# Patient Record
Sex: Male | Born: 1960 | Hispanic: Yes | Marital: Married
Health system: Southern US, Community
[De-identification: ages and names within clinical notes are randomized; demographics above are authoritative.]

## PROBLEM LIST (undated history)

## (undated) DIAGNOSIS — E876 Hypokalemia: Secondary | ICD-10-CM

## (undated) DIAGNOSIS — G4700 Insomnia, unspecified: Secondary | ICD-10-CM

## (undated) DIAGNOSIS — K219 Gastro-esophageal reflux disease without esophagitis: Secondary | ICD-10-CM

## (undated) DIAGNOSIS — M48061 Spinal stenosis, lumbar region without neurogenic claudication: Secondary | ICD-10-CM

## (undated) DIAGNOSIS — F419 Anxiety disorder, unspecified: Secondary | ICD-10-CM

## (undated) DIAGNOSIS — M4310 Spondylolisthesis, site unspecified: Secondary | ICD-10-CM

---

## 2014-03-10 ENCOUNTER — Emergency Department: Payer: Self-pay | Admitting: Emergency Medicine

## 2014-06-15 ENCOUNTER — Emergency Department: Payer: Self-pay | Admitting: Emergency Medicine

## 2017-09-24 ENCOUNTER — Emergency Department: Payer: Medicaid Other

## 2017-09-24 ENCOUNTER — Other Ambulatory Visit: Payer: Self-pay

## 2017-09-24 ENCOUNTER — Emergency Department
Admission: EM | Admit: 2017-09-24 | Discharge: 2017-09-25 | Disposition: A | Discharge status: Home / Self Care | Payer: Medicaid Other | Attending: Emergency Medicine | Admitting: Emergency Medicine

## 2017-09-24 ENCOUNTER — Encounter: Payer: Self-pay | Admitting: Emergency Medicine

## 2017-09-24 DIAGNOSIS — T424X1A Poisoning by benzodiazepines, accidental (unintentional), initial encounter: Secondary | ICD-10-CM | POA: Insufficient documentation

## 2017-09-24 DIAGNOSIS — R4182 Altered mental status, unspecified: Secondary | ICD-10-CM | POA: Diagnosis present

## 2017-09-24 HISTORY — DX: Gastro-esophageal reflux disease without esophagitis: K21.9

## 2017-09-24 HISTORY — DX: Anxiety disorder, unspecified: F41.9

## 2017-09-24 HISTORY — DX: Spondylolisthesis, site unspecified: M43.10

## 2017-09-24 HISTORY — DX: Hypokalemia: E87.6

## 2017-09-24 HISTORY — DX: Spinal stenosis, lumbar region without neurogenic claudication: M48.061

## 2017-09-24 HISTORY — DX: Insomnia, unspecified: G47.00

## 2017-09-24 LAB — URINALYSIS, COMPLETE (UACMP) WITH MICROSCOPIC
BACTERIA UA: NONE SEEN
Bilirubin Urine: NEGATIVE
Glucose, UA: NEGATIVE mg/dL
Ketones, ur: NEGATIVE mg/dL
Leukocytes, UA: NEGATIVE
Nitrite: NEGATIVE
Protein, ur: NEGATIVE mg/dL
SQUAMOUS EPITHELIAL / LPF: NONE SEEN
Specific Gravity, Urine: 1.015 (ref 1.005–1.030)
pH: 5 (ref 5.0–8.0)

## 2017-09-24 LAB — CBC WITH DIFFERENTIAL/PLATELET
BASOS PCT: 0 %
Basophils Absolute: 0 10*3/uL (ref 0–0.1)
EOS ABS: 0.1 10*3/uL (ref 0–0.7)
Eosinophils Relative: 1 %
HEMATOCRIT: 44.5 % (ref 40.0–52.0)
HEMOGLOBIN: 14.5 g/dL (ref 13.0–18.0)
LYMPHS ABS: 0.6 10*3/uL — AB (ref 1.0–3.6)
Lymphocytes Relative: 5 %
MCH: 28.8 pg (ref 26.0–34.0)
MCHC: 32.5 g/dL (ref 32.0–36.0)
MCV: 88.7 fL (ref 80.0–100.0)
Monocytes Absolute: 0.9 10*3/uL (ref 0.2–1.0)
Monocytes Relative: 7 %
NEUTROS ABS: 11.7 10*3/uL — AB (ref 1.4–6.5)
NEUTROS PCT: 87 %
Platelets: 168 10*3/uL (ref 150–440)
RBC: 5.02 MIL/uL (ref 4.40–5.90)
RDW: 14.5 % (ref 11.5–14.5)
WBC: 13.3 10*3/uL — AB (ref 3.8–10.6)

## 2017-09-24 LAB — URINE DRUG SCREEN, QUALITATIVE (ARMC ONLY)
Amphetamines, Ur Screen: NOT DETECTED
BARBITURATES, UR SCREEN: NOT DETECTED
Benzodiazepine, Ur Scrn: POSITIVE — AB
COCAINE METABOLITE, UR ~~LOC~~: NOT DETECTED
Cannabinoid 50 Ng, Ur ~~LOC~~: NOT DETECTED
MDMA (ECSTASY) UR SCREEN: NOT DETECTED
METHADONE SCREEN, URINE: NOT DETECTED
OPIATE, UR SCREEN: NOT DETECTED
Phencyclidine (PCP) Ur S: NOT DETECTED
Tricyclic, Ur Screen: POSITIVE — AB

## 2017-09-24 LAB — GLUCOSE, CAPILLARY: Glucose-Capillary: 227 mg/dL — ABNORMAL HIGH (ref 65–99)

## 2017-09-24 LAB — PROTIME-INR
INR: 0.99
Prothrombin Time: 13 seconds (ref 11.4–15.2)

## 2017-09-24 LAB — ETHANOL: Alcohol, Ethyl (B): <10 mg/dL (ref <10)

## 2017-09-24 NOTE — ED Notes (Signed)
Patient transported to CT 

## 2017-09-24 NOTE — ED Provider Notes (Signed)
[facility] Emergency Department Provider Note  ____________________________________________   First MD Initiated Contact with Patient 09/24/17 2254     (approximate)  I have reviewed the triage vital signs and the nursing notes.   HISTORY  Chief Complaint Drug Overdose  L5 exemption history limited by the patient's clinical condition  HPI James Beck is a 57 y.o. male comes to the emergency department for altered mental status.  According to EMS the patient may have taken some combination of hydroxyzine, clonazepam, Seroquel, and Xanax earlier today.  When EMS arrived he was responsive to painful stimulus.  Normal blood sugar in route.  Further history limited by his clinical condition.  According to EMS the patient does have a recent history of unintentional ingestion of 5 tablets of Seroquel.   Past Medical History:  Diagnosis Date  . Anxiety   . GERD (gastroesophageal reflux disease)   . Hypokalemia   . Insomnia   . Lumbar stenosis   . Spondylolisthesis     There are no active problems to display for this patient.     Prior to Admission medications   Not on File    Allergies Patient has no known allergies.  History reviewed. No pertinent family history.  Social History Social History   Tobacco Use  . Smoking status: Unknown If Ever Smoked  Substance Use Topics  . Alcohol use: No    Frequency: Never  . Drug use: No    Review of Systems Level 5 exemption history limited by the patient's clinical condition  ____________________________________________   PHYSICAL EXAM:  VITAL SIGNS: ED Triage Vitals [09/24/17 2252]  Enc Vitals Group     BP      Pulse      Resp      Temp      Temp src      SpO2 (!) 80 %     Weight      Height      Head Circumference      Peak Flow      Pain Score      Pain Loc      Pain Edu?      Excl. in GC?     Constitutional: Somnolent but responsive to verbal stimulus alert and  oriented x4 falls asleep quickly Eyes: PERRL EOMI. pupils are 3 mm to 2 mm and brisk Head: Atraumatic. Nose: No congestion/rhinnorhea. Mouth/Throat: No trismus Neck: No stridor.   Cardiovascular: Normal rate, regular rhythm. Grossly normal heart sounds.  Good peripheral circulation. Respiratory: Decreased respiratory effort lungs are clear no retractions Gastrointestinal: Soft nontender Musculoskeletal: No lower extremity edema   Neurologic: Moves all 4 feels all 4 Skin:  Skin is warm, dry and intact. No rash noted. Psychiatric: Somnolent  ____________________________________________   DIFFERENTIAL includes but not limited to  Alcohol intoxication, benzodiazepine overdose, opiate overdose, metabolic derangement, intracerebral hemorrhage, meningitis, encephalitis ____________________________________________   LABS (all labs ordered are listed, but only abnormal results are displayed)  Labs Reviewed  GLUCOSE, CAPILLARY - Abnormal; Notable for the following components:      Result Value   Glucose-Capillary 227 (*)    All other components within normal limits  URINALYSIS, COMPLETE (UACMP) WITH MICROSCOPIC - Abnormal; Notable for the following components:   Color, Urine YELLOW (*)    APPearance CLEAR (*)    Hgb urine dipstick MODERATE (*)    All other components within normal limits  ACETAMINOPHEN LEVEL - Abnormal; Notable for the following components:   Acetaminophen (  Tylenol), Serum <10 (*)    All other components within normal limits  COMPREHENSIVE METABOLIC PANEL - Abnormal; Notable for the following components:   Chloride 97 (*)    CO2 33 (*)    Glucose, Bld 193 (*)    Calcium 8.7 (*)    ALT 16 (*)    All other components within normal limits  CBC WITH DIFFERENTIAL/PLATELET - Abnormal; Notable for the following components:   WBC 13.3 (*)    Neutro Abs 11.7 (*)    Lymphs Abs 0.6 (*)    All other components within normal limits  URINE DRUG SCREEN, QUALITATIVE ([facility]  ONLY) - Abnormal; Notable for the following components:   Tricyclic, Ur Screen POSITIVE (*)    Benzodiazepine, Ur Scrn POSITIVE (*)    All other components within normal limits  ETHANOL  SALICYLATE LEVEL  TSH  AMMONIA  CK  PROTIME-INR    Lab work reviewed by me with a number of abnormalities including benzodiazepine and tricyclic positive.  Elevated white count is nonspecific and likely secondary to stress __________________________________________  EKG  ED ECG REPORT I, Merrily BrittleNeil Jedediah Noda, the attending physician, personally viewed and interpreted this ECG.  Date: 09/24/2017 EKG Time:  Rate: 105 Rhythm: Sinus tachycardia QRS Axis: normal Intervals: normal ST/T Wave abnormalities: normal Narrative Interpretation: no evidence of acute ischemia  ____________________________________________  RADIOLOGY  Head CT reviewed by me with no acute disease Chest x-ray reviewed by me with slight edema ____________________________________________   PROCEDURES  Procedure(s) performed: no  Procedures  Critical Care performed: no  Observation: no ____________________________________________   INITIAL IMPRESSION / ASSESSMENT AND PLAN / ED COURSE  Pertinent labs & imaging results that were available during my care of the patient were reviewed by me and considered in my medical decision making (see chart for details).  On arrival the patient is clearly confused and not behaving appropriately.  Pupils are 3-2 mm and brisk.  Doubt opioid overdose at this time.  We will continue supportive care will broad workup including head CT is pending.  He does have a small laceration to the anterior right aspect of his tongue.  Denies history of seizure disorder.     ----------------------------------------- 11:39 PM on 09/24/2017 -----------------------------------------  Just back from CT scan.  Still slurring his speech and behaving bizarre, however protecting his  airway. ____________________________________________   ----------------------------------------- 6:13 AM on 09/25/2017 -----------------------------------------  The patient is now considerably more awake and appropriate.  He reports taking 3 clonazepam as well as 3 Xanax prior to going to sleep which is consistent with his toxidrome.  His wife is able to drive him home later this morning.  He is medically stable for outpatient management verbalizes understanding and agreement with the plan.  FINAL CLINICAL IMPRESSION(S) / ED DIAGNOSES  Final diagnoses:  Benzodiazepine overdose, accidental or unintentional, initial encounter      NEW MEDICATIONS STARTED DURING THIS VISIT:  New Prescriptions   No medications on file     Note:  This document was prepared using Dragon voice recognition software and may include unintentional dictation errors.     Merrily Brittleifenbark, Tysheka Fanguy, MD 09/25/17 [phone]

## 2017-09-24 NOTE — ED Notes (Signed)
Pt 94% on 4L via .

## 2017-09-24 NOTE — ED Triage Notes (Signed)
Pt presents to ED via [facility] with c/o possible drug overdose. EMS reports pt's girlfriend found patient unresponsive, EMS reports that patient would snore in response to sternal rubs. EMS reports snoring respirations with periods of apnea, and unresponsive.  Per EMS pt was 80% on RA, on 4L via [place] pt 98%. Upon arrival pt is noted to be more alert, states he took Klonopin, Xanax, hydroxyzine. Pt is noted to be lethargic, slurred speech.

## 2017-09-25 LAB — COMPREHENSIVE METABOLIC PANEL
ALBUMIN: 4 g/dL (ref 3.5–5.0)
ALT: 16 U/L — ABNORMAL LOW (ref 17–63)
AST: 22 U/L (ref 15–41)
Alkaline Phosphatase: 70 U/L (ref 38–126)
Anion gap: 8 (ref 5–15)
BILIRUBIN TOTAL: 0.5 mg/dL (ref 0.3–1.2)
BUN: 11 mg/dL (ref 6–20)
CHLORIDE: 97 mmol/L — AB (ref 101–111)
CO2: 33 mmol/L — ABNORMAL HIGH (ref 22–32)
Calcium: 8.7 mg/dL — ABNORMAL LOW (ref 8.9–10.3)
Creatinine, Ser: 1.15 mg/dL (ref 0.61–1.24)
GFR calc Af Amer: >60 mL/min (ref >60)
GFR calc non Af Amer: >60 mL/min (ref >60)
GLUCOSE: 193 mg/dL — AB (ref 65–99)
POTASSIUM: 4.8 mmol/L (ref 3.5–5.1)
Sodium: 138 mmol/L (ref 135–145)
Total Protein: 7.4 g/dL (ref 6.5–8.1)

## 2017-09-25 LAB — SALICYLATE LEVEL: Salicylate Lvl: <7 mg/dL (ref 2.8–30.0)

## 2017-09-25 LAB — CK: CK TOTAL: 49 U/L (ref 49–397)

## 2017-09-25 LAB — ACETAMINOPHEN LEVEL

## 2017-09-25 LAB — TSH: TSH: 0.701 u[IU]/mL (ref 0.350–4.500)

## 2017-09-25 LAB — AMMONIA: AMMONIA: 20 umol/L (ref 9–35)

## 2017-09-25 NOTE — Discharge Instructions (Signed)
DO NOT EVER TAKE ANYONE ELSE'S PRESCRIPTION AGAIN - IT IS EXTREMELY DANGEROUS!  DO NOT EVER MIX KLONOPIN AND XANAX AGAIN - IT CAN MAKE YOU STOP BREATHING AND YOU CAN DIE  Today you were very lucky and nothing dangerous happened.  Please follow-up with your primary care physician tomorrow for recheck and return to the emergency department for any concerns.  It was a pleasure to take care of you today, and thank you for coming to our emergency department.  If you have any questions or concerns before leaving please ask the nurse to grab me and I'm more than happy to go through your aftercare instructions again.  If you were prescribed any opioid pain medication today such as Norco, Vicodin, Percocet, morphine, hydrocodone, or oxycodone please make sure you do not drive when you are taking this medication as it can alter your ability to drive safely.  If you have any concerns once you are home that you are not improving or are in fact getting worse before you can make it to your follow-up appointment, please do not hesitate to call 911 and come back for further evaluation.  Merrily BrittleNeil Tavia Stave, MD  Results for orders placed or performed during the hospital encounter of 09/24/17  Glucose, capillary  Result Value Ref Range   Glucose-Capillary 227 (H) 65 - 99 mg/dL  Urinalysis, Complete w Microscopic  Result Value Ref Range   Color, Urine YELLOW (A) YELLOW   APPearance CLEAR (A) CLEAR   Specific Gravity, Urine 1.015 1.005 - 1.030   pH 5.0 5.0 - 8.0   Glucose, UA NEGATIVE NEGATIVE mg/dL   Hgb urine dipstick MODERATE (A) NEGATIVE   Bilirubin Urine NEGATIVE NEGATIVE   Ketones, ur NEGATIVE NEGATIVE mg/dL   Protein, ur NEGATIVE NEGATIVE mg/dL   Nitrite NEGATIVE NEGATIVE   Leukocytes, UA NEGATIVE NEGATIVE   RBC / HPF 0-5 0 - 5 RBC/hpf   WBC, UA 0-5 0 - 5 WBC/hpf   Bacteria, UA NONE SEEN NONE SEEN   Squamous Epithelial / LPF NONE SEEN NONE SEEN   Mucus PRESENT   Acetaminophen level  Result Value Ref  Range   Acetaminophen (Tylenol), Serum <10 (L) 10 - 30 ug/mL  Comprehensive metabolic panel  Result Value Ref Range   Sodium 138 135 - 145 mmol/L   Potassium 4.8 3.5 - 5.1 mmol/L   Chloride 97 (L) 101 - 111 mmol/L   CO2 33 (H) 22 - 32 mmol/L   Glucose, Bld 193 (H) 65 - 99 mg/dL   BUN 11 6 - 20 mg/dL   Creatinine, Ser 1.611.15 0.61 - 1.24 mg/dL   Calcium 8.7 (L) 8.9 - 10.3 mg/dL   Total Protein 7.4 6.5 - 8.1 g/dL   Albumin 4.0 3.5 - 5.0 g/dL   AST 22 15 - 41 U/L   ALT 16 (L) 17 - 63 U/L   Alkaline Phosphatase 70 38 - 126 U/L   Total Bilirubin 0.5 0.3 - 1.2 mg/dL   GFR calc non Af Amer >60 >60 mL/min   GFR calc Af Amer >60 >60 mL/min   Anion gap 8 5 - 15  Ethanol  Result Value Ref Range   Alcohol, Ethyl (B) <10 <10 mg/dL  Salicylate level  Result Value Ref Range   Salicylate Lvl <7.0 2.8 - 30.0 mg/dL  CBC with Differential  Result Value Ref Range   WBC 13.3 (H) 3.8 - 10.6 K/uL   RBC 5.02 4.40 - 5.90 MIL/uL   Hemoglobin 14.5 13.0 - 18.0  g/dL   HCT 56.2 13.0 - 86.5 %   MCV 88.7 80.0 - 100.0 fL   MCH 28.8 26.0 - 34.0 pg   MCHC 32.5 32.0 - 36.0 g/dL   RDW 78.4 69.6 - 29.5 %   Platelets 168 150 - 440 K/uL   Neutrophils Relative % 87 %   Neutro Abs 11.7 (H) 1.4 - 6.5 K/uL   Lymphocytes Relative 5 %   Lymphs Abs 0.6 (L) 1.0 - 3.6 K/uL   Monocytes Relative 7 %   Monocytes Absolute 0.9 0.2 - 1.0 K/uL   Eosinophils Relative 1 %   Eosinophils Absolute 0.1 0 - 0.7 K/uL   Basophils Relative 0 %   Basophils Absolute 0.0 0 - 0.1 K/uL  Urine Drug Screen, Qualitative  Result Value Ref Range   Tricyclic, Ur Screen POSITIVE (A) NONE DETECTED   Amphetamines, Ur Screen NONE DETECTED NONE DETECTED   MDMA (Ecstasy)Ur Screen NONE DETECTED NONE DETECTED   Cocaine Metabolite,Ur [place] NONE DETECTED NONE DETECTED   Opiate, Ur Screen NONE DETECTED NONE DETECTED   Phencyclidine (PCP) Ur S NONE DETECTED NONE DETECTED   Cannabinoid 50 Ng, Ur [place] NONE DETECTED NONE DETECTED   Barbiturates, Ur Screen  NONE DETECTED NONE DETECTED   Benzodiazepine, Ur Scrn POSITIVE (A) NONE DETECTED   Methadone Scn, Ur NONE DETECTED NONE DETECTED  TSH  Result Value Ref Range   TSH 0.701 0.350 - 4.500 uIU/mL  Ammonia  Result Value Ref Range   Ammonia 20 9 - 35 umol/L  CK  Result Value Ref Range   Total CK 49 49 - 397 U/L  Protime-INR  Result Value Ref Range   Prothrombin Time 13.0 11.4 - 15.2 seconds   INR 0.99    Ct Head Wo Contrast  Result Date: 09/25/2017 CLINICAL DATA:  Initial evaluation for acute altered mental status, possible drug overdose. EXAM: CT HEAD WITHOUT CONTRAST TECHNIQUE: Contiguous axial images were obtained from the base of the skull through the vertex without intravenous contrast. COMPARISON:  None. FINDINGS: Brain: Age-related cerebral atrophy. No acute intracranial hemorrhage. No acute large vessel territory infarct. No mass lesion, midline shift or mass effect. No hydrocephalus. Gray-white matter differentiation maintained at this time. No extra-axial fluid collection. Vascular: No asymmetric hyperdense vessel. Skull: Scalp soft tissues and calvarium within normal limits. Sinuses/Orbits: Globes and orbital soft tissues normal. Moderate mucosal thickening within the frontal ethmoidal sinuses as well as the maxillary sinuses. Mastoid air cells are clear. Other: None. IMPRESSION: 1. No acute intracranial abnormality. 2. Moderate inflammatory/allergic paranasal sinus disease. Electronically Signed   By: Rise Mu M.D.   On: 09/25/2017 00:13   Dg Chest Port 1 View  Result Date: 09/24/2017 CLINICAL DATA:  Possible drug overdose.  Dyspnea. EXAM: PORTABLE CHEST 1 VIEW COMPARISON:  None. FINDINGS: Heart is borderline enlarged. No aortic aneurysm. Mild interstitial edema without pneumonic consolidation, effusion or pneumothorax. No acute osseous abnormality. IMPRESSION: Borderline cardiomegaly with mild interstitial edema. Electronically Signed   By: Tollie Eth M.D.   On: 09/24/2017  23:45

## 2017-09-25 NOTE — ED Notes (Signed)
O2 taken off pt. Pt was able to become alert enough to maintain oxygen level.

## 2017-09-25 NOTE — ED Notes (Addendum)
Pt has been having periods of apnea while sleeping, with O2 dropping down to as low as 81% sat but will increase above 90 when pt instructed to breath deeply through nose, pt on 4L o2 nasal canula

## 2017-09-25 NOTE — ED Notes (Signed)
Pt placed back on 4L O2 to increase oxygen saturation

## 2017-09-25 NOTE — ED Notes (Signed)
Oxygen taken off of pt to see if pt can maintain Oxygen saturation above 90%

## 2018-03-28 ENCOUNTER — Emergency Department: Payer: Medicaid Other

## 2018-03-28 ENCOUNTER — Encounter: Payer: Self-pay | Admitting: Emergency Medicine

## 2018-03-28 ENCOUNTER — Emergency Department
Admission: EM | Admit: 2018-03-28 | Discharge: 2018-03-28 | Disposition: A | Discharge status: Home / Self Care | Payer: Medicaid Other | Attending: Emergency Medicine | Admitting: Emergency Medicine

## 2018-03-28 DIAGNOSIS — Y929 Unspecified place or not applicable: Secondary | ICD-10-CM | POA: Diagnosis not present

## 2018-03-28 DIAGNOSIS — Y9301 Activity, walking, marching and hiking: Secondary | ICD-10-CM | POA: Diagnosis not present

## 2018-03-28 DIAGNOSIS — W010XXA Fall on same level from slipping, tripping and stumbling without subsequent striking against object, initial encounter: Secondary | ICD-10-CM | POA: Insufficient documentation

## 2018-03-28 DIAGNOSIS — Y999 Unspecified external cause status: Secondary | ICD-10-CM | POA: Insufficient documentation

## 2018-03-28 DIAGNOSIS — S301XXA Contusion of abdominal wall, initial encounter: Secondary | ICD-10-CM

## 2018-03-28 DIAGNOSIS — I951 Orthostatic hypotension: Secondary | ICD-10-CM | POA: Diagnosis not present

## 2018-03-28 DIAGNOSIS — R55 Syncope and collapse: Secondary | ICD-10-CM | POA: Diagnosis present

## 2018-03-28 LAB — URINALYSIS, COMPLETE (UACMP) WITH MICROSCOPIC
Bacteria, UA: NONE SEEN
Bilirubin Urine: NEGATIVE
GLUCOSE, UA: NEGATIVE mg/dL
Ketones, ur: NEGATIVE mg/dL
Leukocytes, UA: NEGATIVE
NITRITE: NEGATIVE
Protein, ur: NEGATIVE mg/dL
pH: 5 (ref 5.0–8.0)

## 2018-03-28 LAB — BASIC METABOLIC PANEL
Anion gap: 7 (ref 5–15)
BUN: 15 mg/dL (ref 6–20)
CO2: 27 mmol/L (ref 22–32)
CREATININE: 1.1 mg/dL (ref 0.61–1.24)
Calcium: 8.9 mg/dL (ref 8.9–10.3)
Chloride: 108 mmol/L (ref 98–111)
GFR calc Af Amer: >60 mL/min (ref >60)
GLUCOSE: 105 mg/dL — AB (ref 70–99)
Potassium: 4.3 mmol/L (ref 3.5–5.1)
Sodium: 142 mmol/L (ref 135–145)

## 2018-03-28 LAB — CBC
HCT: 41.2 % (ref 40.0–52.0)
Hemoglobin: 14 g/dL (ref 13.0–18.0)
MCH: 29.8 pg (ref 26.0–34.0)
MCHC: 34.1 g/dL (ref 32.0–36.0)
MCV: 87.5 fL (ref 80.0–100.0)
Platelets: 238 10*3/uL (ref 150–440)
RBC: 4.71 MIL/uL (ref 4.40–5.90)
RDW: 15.1 % — AB (ref 11.5–14.5)
WBC: 9.1 10*3/uL (ref 3.8–10.6)

## 2018-03-28 MED ORDER — IOPAMIDOL (ISOVUE-370) INJECTION 76%
75.0000 mL | Freq: Once | INTRAVENOUS | Status: AC | PRN
  Administered 2018-03-28: 75 mL via INTRAVENOUS

## 2018-03-28 MED ORDER — SODIUM CHLORIDE 0.9 % IV BOLUS
1000.0000 mL | Freq: Once | INTRAVENOUS | Status: AC
  Administered 2018-03-28: 1000 mL via INTRAVENOUS

## 2018-03-28 NOTE — ED Notes (Signed)
Patient transported to CT 

## 2018-03-28 NOTE — ED Triage Notes (Signed)
Patient presents to the ED post syncopal episode last night around 10pm.  Patient states, "my blood pressure was real low, I took a few steps forward and then that was it."  Patient reports hitting his left side on a door knob and reports pain to left lower ribs.  Patient states his blood pressure earlier in the day yesterday was 90/70.  Patient had a back surgery 3 years ago and reports having issues with low blood pressure since then.  Patient states he takes tylenol for pain.

## 2018-03-28 NOTE — ED Notes (Addendum)
Pt reports that he fell against hallway closet knob last night and since then has been having pain in left side - pt denies shortness of breath or difficulty breathing

## 2018-03-28 NOTE — ED Provider Notes (Signed)
[facility] Emergency Department Provider Note ____________________________________________   First MD Initiated Contact with Patient 03/28/18 1403     (approximate)  I have reviewed the triage vital signs and the nursing notes.   HISTORY  Chief Complaint Loss of Consciousness    HPI James Beck is a 57 y.o. male here for evaluation after he passed out last night  Patient reports that he has had low blood pressure for a couple of years since he had a lumbosacral surgery, reports he suffers from regularly and when he gets up he has to get up very slowly sit up for a minute and then walk a couple minutes afterwards.  He reports he is passed out many a time because of low blood pressure, his doctor follows him and he has about once monthly visits with his doctor because of the low blood pressure issues which she is had.  He does not take any blood pressure medicine, but is also not any medicine to raise his blood pressure.  Reports same symptoms as he normally gets of low blood pressure last night, but when he stood up he passed out.  He fell onto his left flank and has been having pain in his left back and just below his left rib cage for the last 12 hours.  He has not had any episodes where he felt like using a pass out today.  Is been eating and drinking normally.  No chest pain or trouble breathing.  Does report a sore underneath the left ribs but not really pain in the chest, is mostly pain along his flank and left back.  He wants to make sure none of his hardware got loosened.    Past Medical History:  Diagnosis Date  . Anxiety   . GERD (gastroesophageal reflux disease)   . Hypokalemia   . Insomnia   . Lumbar stenosis   . Spondylolisthesis     There are no active problems to display for this patient.   History reviewed. No pertinent surgical history.  Prior to Admission medications   Not on File    Allergies Patient has no known  allergies.  No family history on file.  Social History Social History   Tobacco Use  . Smoking status: Unknown If Ever Smoked  . Smokeless tobacco: Never Used  Substance Use Topics  . Alcohol use: No    Frequency: Never  . Drug use: No    Review of Systems Constitutional: No fever/chills. Eyes: No visual changes. ENT: No sore throat. Cardiovascular: Denies chest pain. Respiratory: Denies shortness of breath.  Some discomfort underneath his left rib cage, just below the left lower "thoracic ribs". Gastrointestinal: No abdominal pain is sore in the left upper abdomen, also in his left flank where he noticed a small bruise.  No nausea, no vomiting.  No diarrhea.  No constipation. Genitourinary: Negative for dysuria. Musculoskeletal: Negative for back pain. Skin: Negative for rash. Neurological: Negative for headaches, focal weakness or numbness.    ____________________________________________   PHYSICAL EXAM:  VITAL SIGNS: ED Triage Vitals  Enc Vitals Group     BP 03/28/18 1340 (!) 134/92     Pulse Rate 03/28/18 1340 63     Resp 03/28/18 1340 18     Temp 03/28/18 1340 98.2 F (36.8 C)     Temp Source 03/28/18 1340 Oral     SpO2 03/28/18 1340 94 %     Weight 03/28/18 1341 217 lb (98.4 kg)  Height 03/28/18 1341 6\' 2"  (1.88 m)     Head Circumference --      Peak Flow --      Pain Score 03/28/18 1340 8     Pain Loc --      Pain Edu? --      Excl. in GC? --     Constitutional: Alert and oriented. Well appearing and in no acute distress.  He is very pleasant. Eyes: Conjunctivae are normal. Head: Atraumatic. Nose: No congestion/rhinnorhea. Mouth/Throat: Mucous membranes are moist. Neck: No stridor.   Cardiovascular: Normal rate, regular rhythm. Grossly normal heart sounds.  Good peripheral circulation. Respiratory: Normal respiratory effort.  No retractions. Lungs CTAB. Gastrointestinal: Soft and nontender for some mild tenderness in the left upper quadrant and  left flank.  No bruising.  No distention. Musculoskeletal: No lower extremity tenderness nor edema.  Moves all extremities well.  Some numbness in the right lower leg which she reports chronic since spinal surgery and unchanged.  No new motor weakness.  He does report some tenderness primarily along the left paraspinous lumbosacral region and has a small area of bruising and contusion about the size of a palm overlying the left lumbosacral region at about the level L5 1 or L2.  No notable hematoma. Neurologic:  Normal speech and language. No gross focal neurologic deficits are appreciated.  Skin:  Skin is warm, dry and intact. No rash noted. Psychiatric: Mood and affect are normal. Speech and behavior are normal.  ____________________________________________   LABS (all labs ordered are listed, but only abnormal results are displayed)  Labs Reviewed  BASIC METABOLIC PANEL - Abnormal; Notable for the following components:      Result Value   Glucose, Bld 105 (*)    All other components within normal limits  CBC - Abnormal; Notable for the following components:   RDW 15.1 (*)    All other components within normal limits  URINALYSIS, COMPLETE (UACMP) WITH MICROSCOPIC - Abnormal; Notable for the following components:   Color, Urine YELLOW (*)    APPearance CLEAR (*)    Specific Gravity, Urine >1.046 (*)    Hgb urine dipstick SMALL (*)    All other components within normal limits   ____________________________________________  EKG  Reviewed and entered by me at 1350 Heart rate 70 QRS 99 QTc 420 Normal sinus rhythm, no evidence of ischemia ____________________________________________  RADIOLOGY    CT scans reviewed negative for acute ____________________________________________   PROCEDURES  Procedure(s) performed: None  Procedures  Critical Care performed: No  ____________________________________________   INITIAL IMPRESSION / ASSESSMENT AND PLAN / ED  COURSE  Pertinent labs & imaging results that were available during my care of the patient were reviewed by me and considered in my medical decision making (see chart for details).  Patient presents for evaluation of pain in the left flank after a fall yesterday.  Ports he fell striking desk edge and knob.  Did lose consciousness but denies head injury.  Takes no blood thinners.  No headaches no neck pain.  Reports pain primarily in his left lower back and flank region.  Given the location of pain just below his left ribs as well and some tenderness will obtain CT to evaluate for splenic or intra-abdominal injury, though my overall pretest probability is low.  X-ray to evaluate lumbosacral hardware.  No evidence of acute or new neurologic abnormality.  Patient reports his past many times in the same fashion sounds as though he is developed notable chronic  hypotension and probably orthostatic hypotension after back surgery.  We will hydrate him well.  Clinical Course as of Mar 28 1612  Mon Mar 28, 2018  1404 BP 105 at gen surg clinic, SBP 90 at PCP office this last week, asymptomatic to it.    [MQ]    Clinical Course User Index [MQ] Sharyn Creamer, MD   Called Dr. Odessa Fleming office (Dr. Graciela Husbands on vacation, but spoke with provider M. Mock) re: low blood pressures, advises that he should have close follow-up with them, continue to stay hydrated, but would not wish for him to be started on any blood pressure elevating medication such as Midrin at this time.  410p Patient resting comfortably.  Feels well except for ongoing pain in the left flank, reports he cannot really take any medication of the Tylenol for which he takes at home.  Does not wish for anything stronger.  Discussed with the patient, will follow up with Dr. Graciela Husbands.  Return precautions discussed.  Appears consistent with syncope secondary to orthostatic changes which appear to be a chronic ongoing process.  His blood pressure is mildly  hypotensive, but has a well-documented history of hypotension and this does not appear to be an acute concern at this time.  Advised patient not to place himself in a dangerous spot like roofs or ladders where if he were to pass out he could fall hard to get injured.  Patient in agreement.  Return precautions and treatment recommendations and follow-up discussed with the patient who is agreeable with the plan.   ____________________________________________   FINAL CLINICAL IMPRESSION(S) / ED DIAGNOSES  Final diagnoses:  Contusion, flank, initial encounter  Orthostatic syncope      NEW MEDICATIONS STARTED DURING THIS VISIT:  New Prescriptions   No medications on file     Note:  This document was prepared using Dragon voice recognition software and may include unintentional dictation errors.     Sharyn Creamer, MD 03/28/18 [phone]

## 2019-06-01 DEATH — deceased

## 2019-10-08 IMAGING — CR DG LUMBAR SPINE 2-3V
1 series · 3 of 3 positions shown · non-contrast
Comparison: CT scan March 28, 2017

CLINICAL DATA: Pain after trauma.

EXAM:
LUMBAR SPINE - 2-3 VIEW

[Series 1: dg lumbar spine 2-3 views · 0.14mm/px · 3 of 3 slices shown]
[im 1/3]
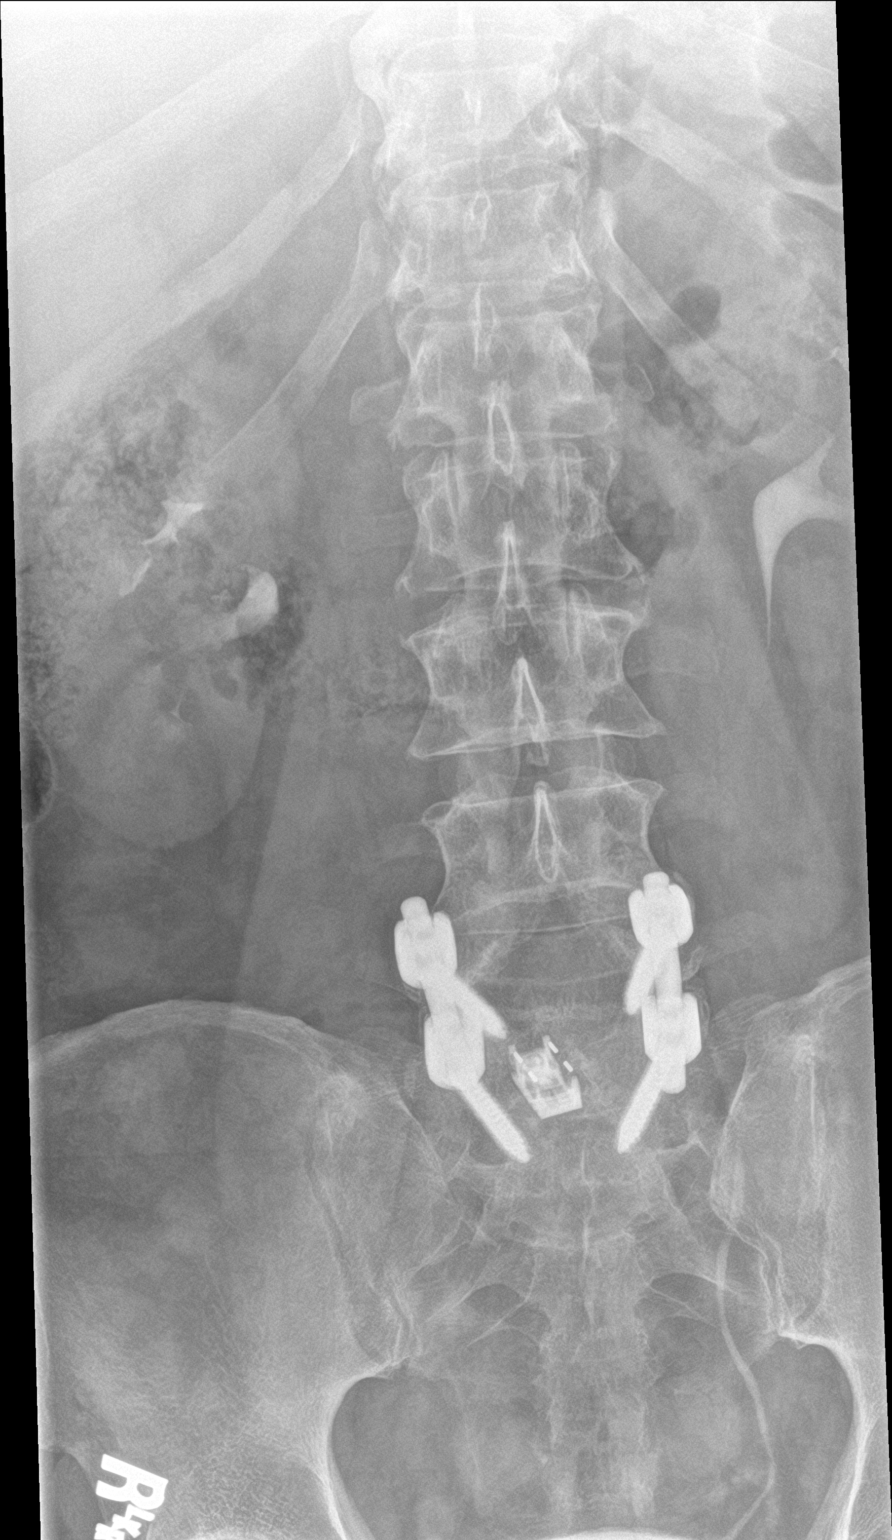
[im 2/3]
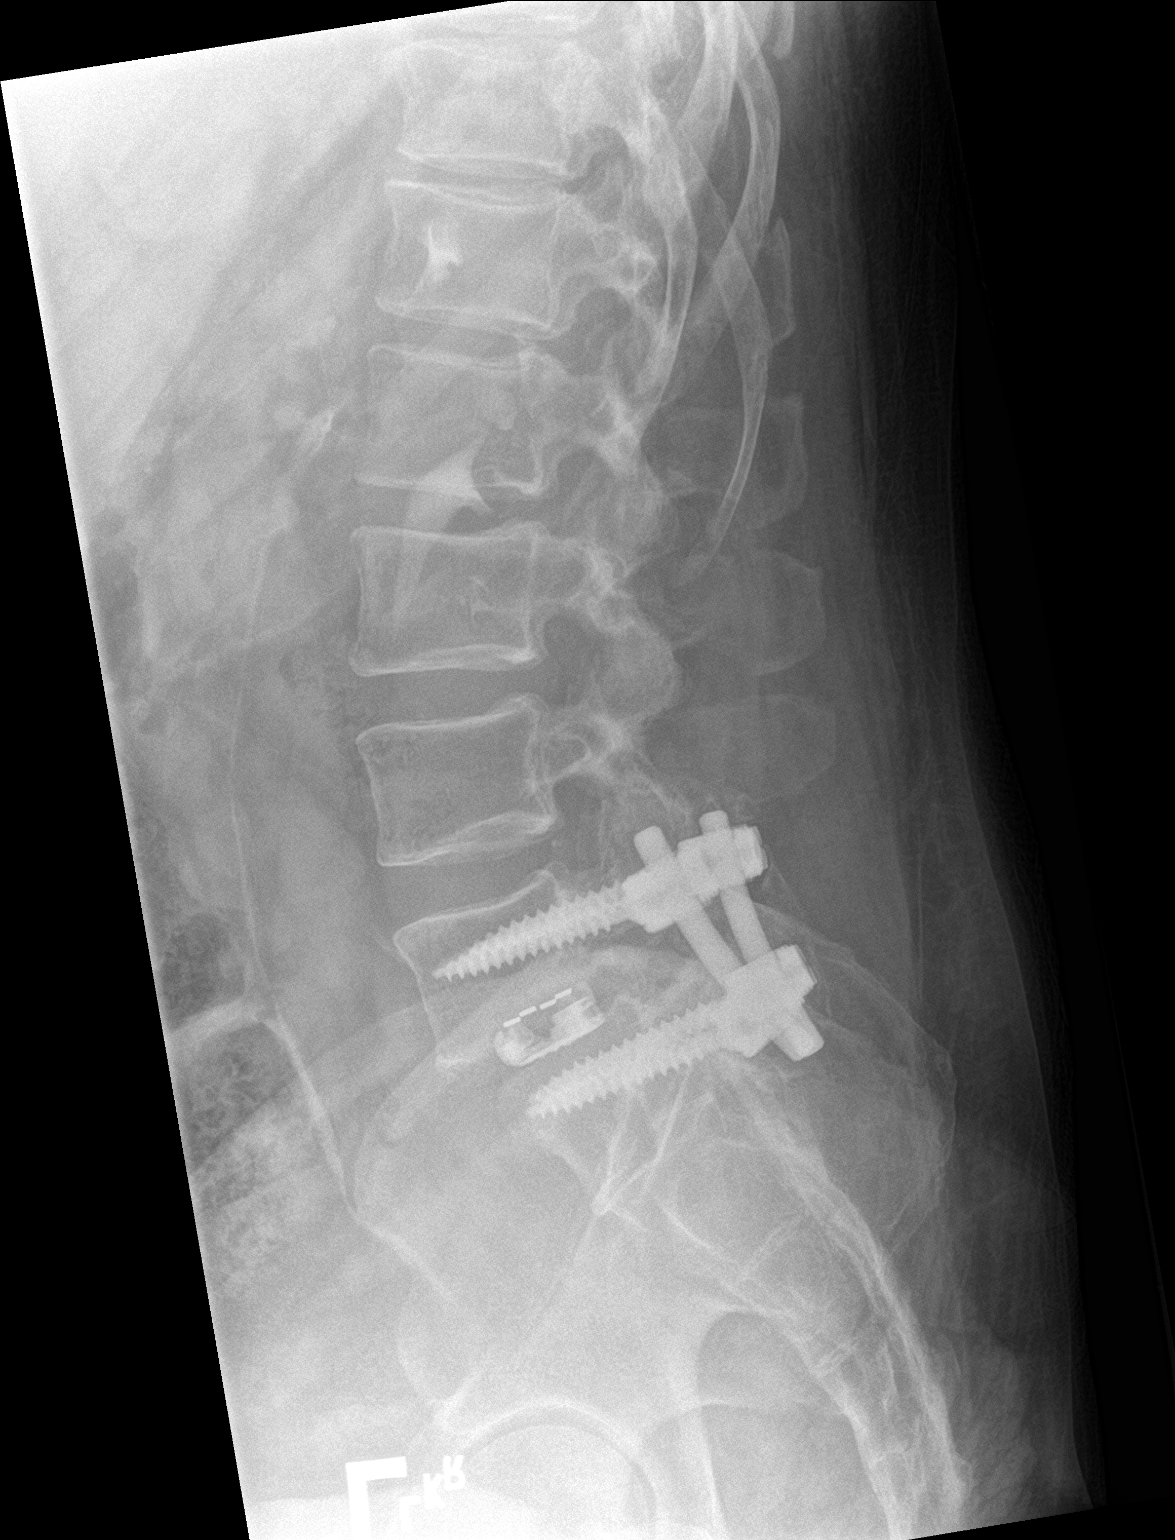
[im 3/3]
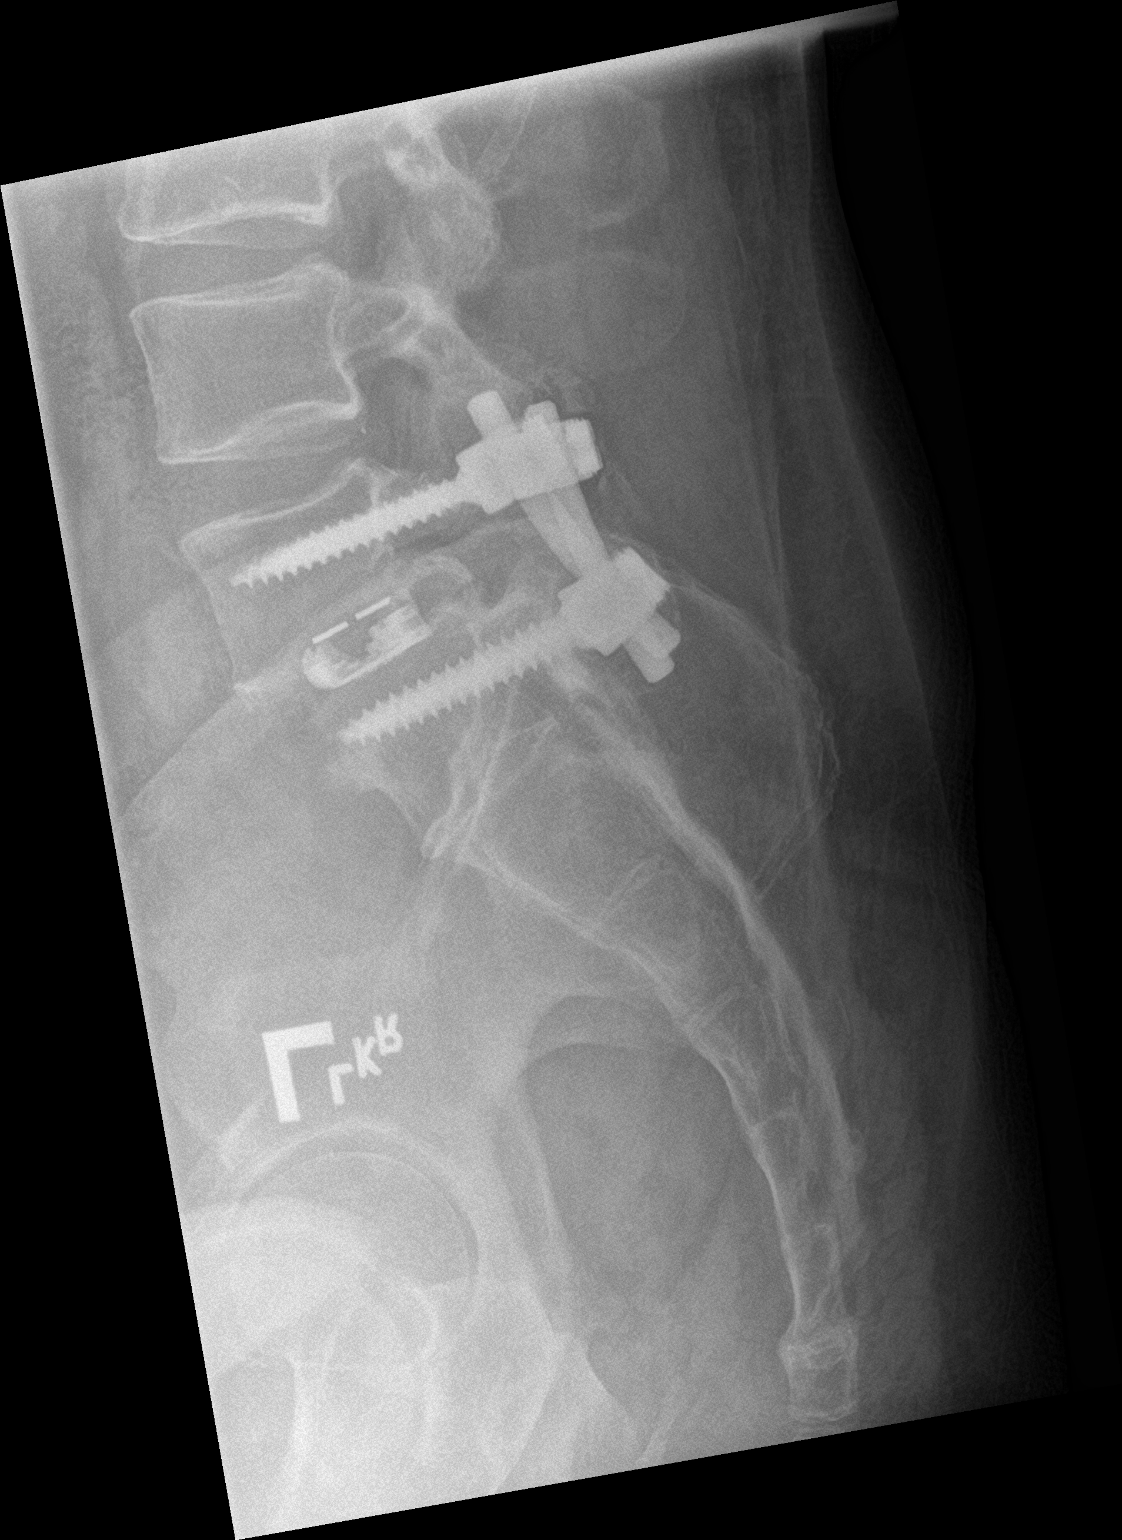

[3 of 3 positions shown; findings below may reference images not displayed]

FINDINGS: Contrast in the kidneys and left ureter. No other soft tissue
abnormalities noted.

Pedicle rods and screws at L5 and S1. A disc spacer device is seen
at the same level. Grade 1 anterolisthesis of L5 versus S1 is
identified. No other malalignment. No acute fracture noted.
IMPRESSION: No acute fracture. Postsurgical changes at L5-S1. Grade 1
anterolisthesis of L5-S1 also seen.

## 2019-10-08 IMAGING — CT CT ABD-PELV W/ CM
2 of 6 series · 14 of 46 positions shown, 16 images · IV contrast (iopamidol)
Comparison: None.

CLINICAL DATA: Blunt abdominal trauma. Hit left side on door knob
with left lower rib pain.

EXAM:
CT ABDOMEN AND PELVIS WITH CONTRAST
TECHNIQUE: Multidetector CT imaging of the abdomen and pelvis was performed
using the standard protocol following bolus administration of
intravenous contrast.
CONTRAST:  75mL TWCAEO-5P1 IOPAMIDOL (TWCAEO-5P1) INJECTION 76%

[Series 2: routine abd/pel with · axial · 0.80mm/px · z∈[-1116,-691]mm · 11 of 103 slices shown, 13 images]
[im 9/103  soft-tissue]
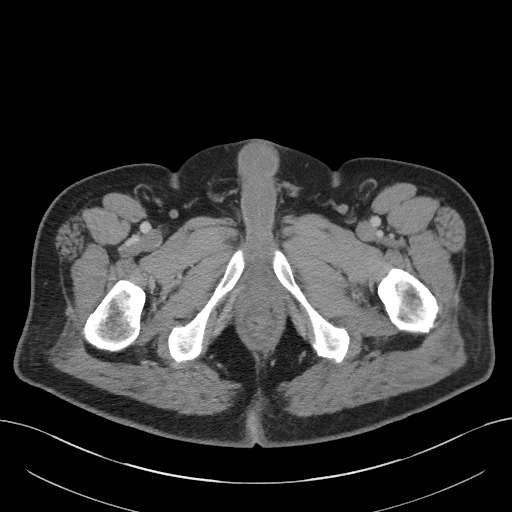
[im 9/103  bone]
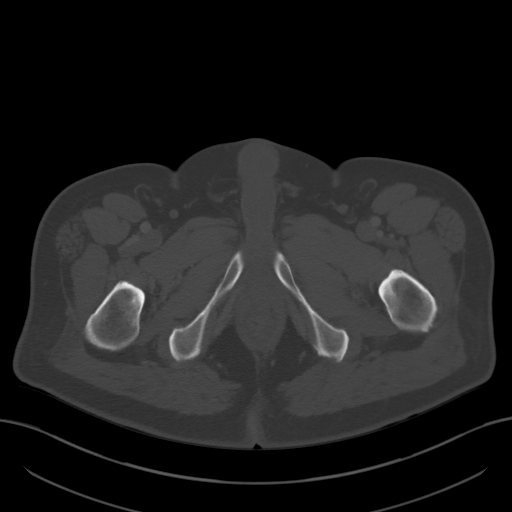
[im 18/103  soft-tissue]
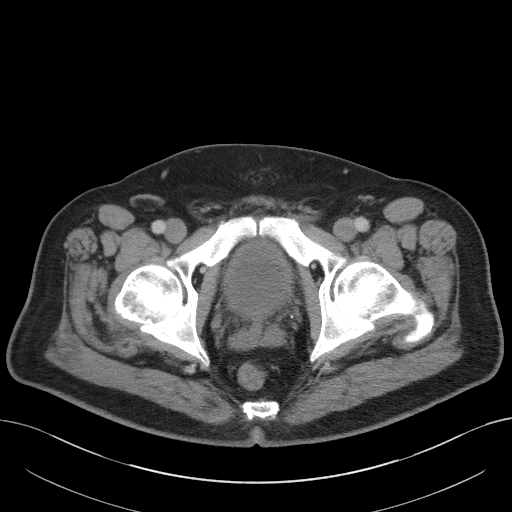
[im 26/103  soft-tissue]
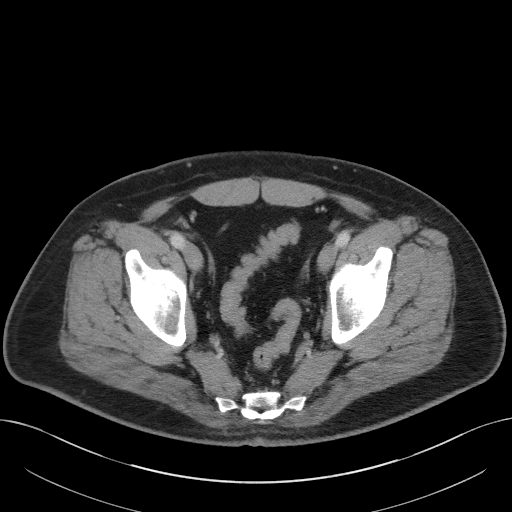
[im 35/103  soft-tissue]
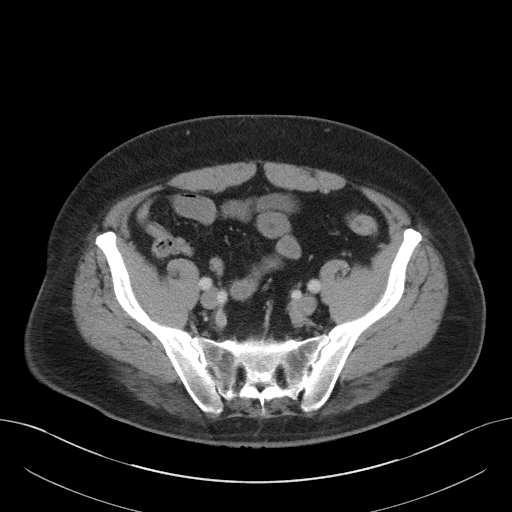
[im 43/103  soft-tissue]
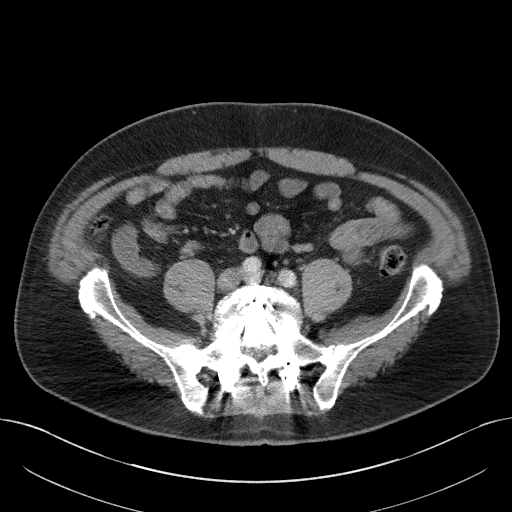
[im 52/103  soft-tissue]
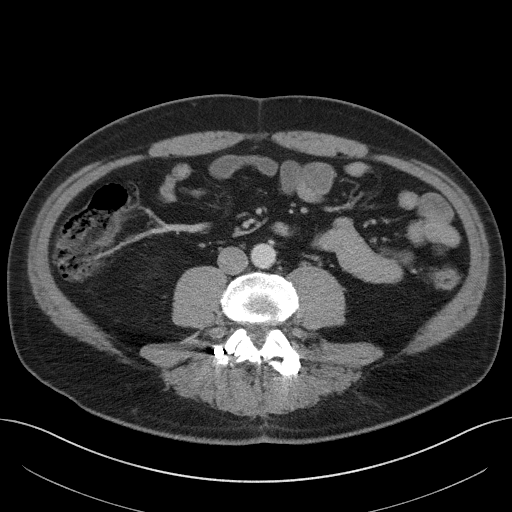
[im 60/103  soft-tissue]
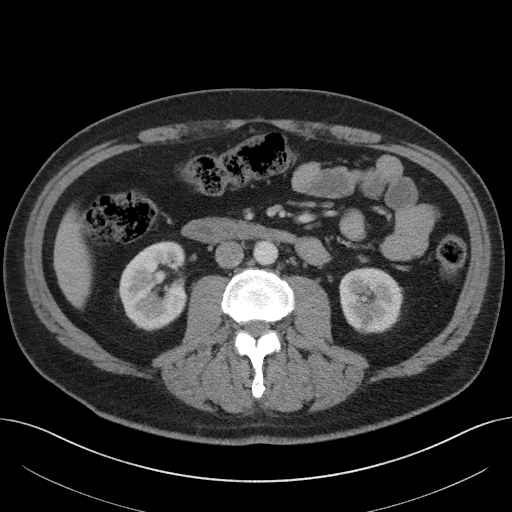
[im 69/103  soft-tissue]
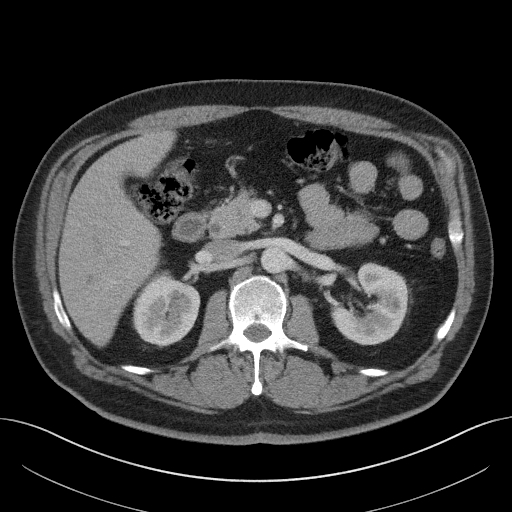
[im 77/103  soft-tissue]
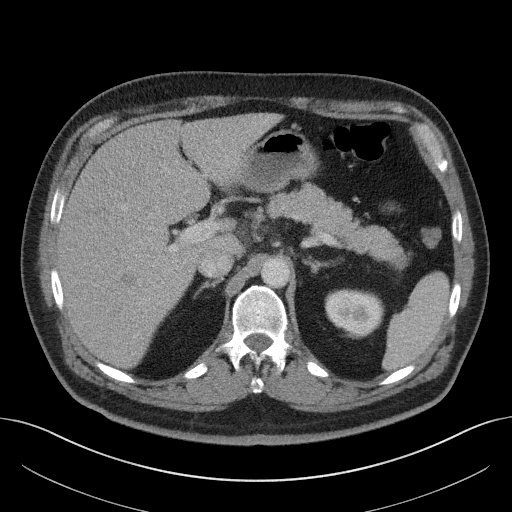
[im 77/103  bone]
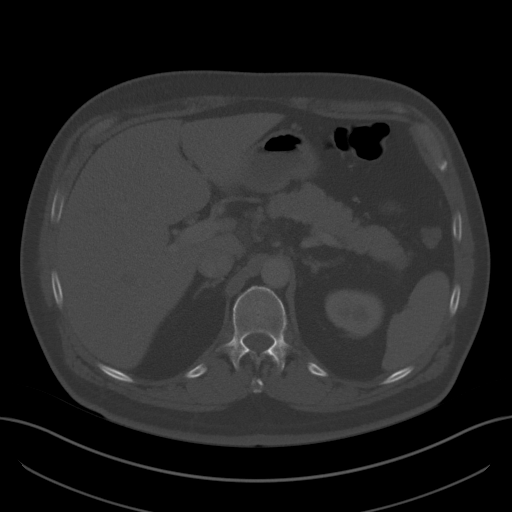
[im 86/103  soft-tissue]
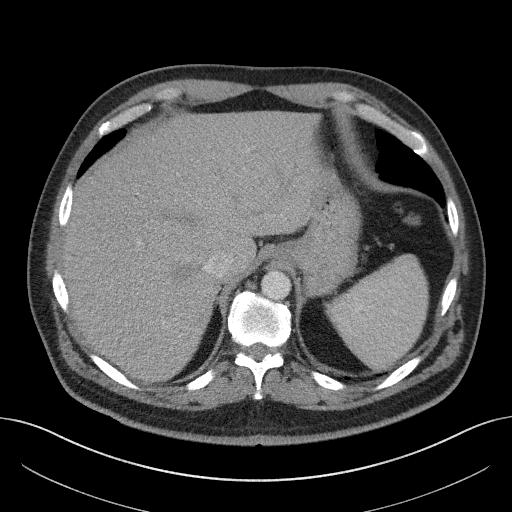
[im 94/103  soft-tissue]
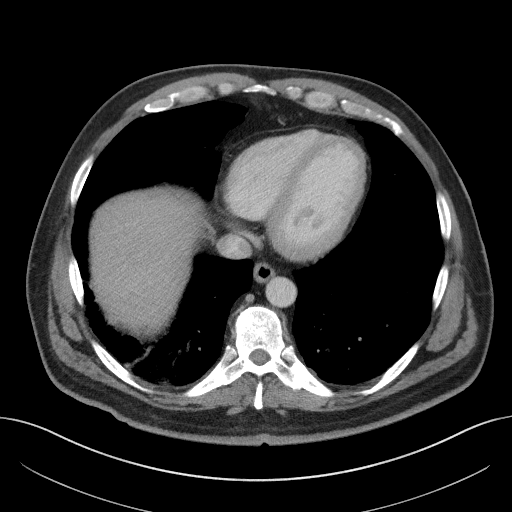

[Series 5: coronal st · coronal · 0.80mm/px · 3 of 101 slices shown]
[im 34/101  soft-tissue]
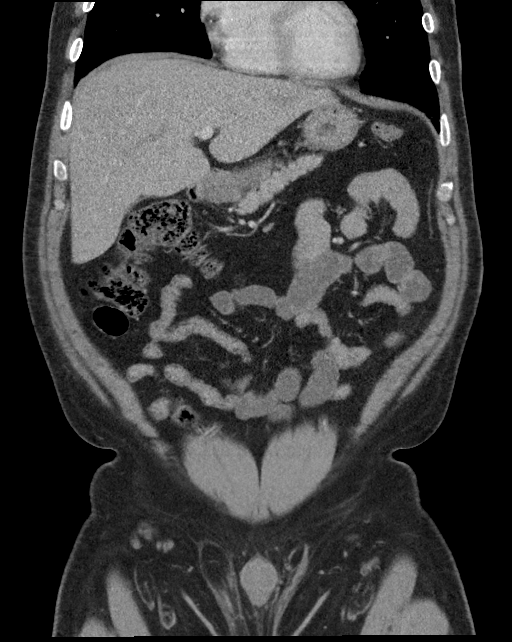
[im 45/101  soft-tissue]
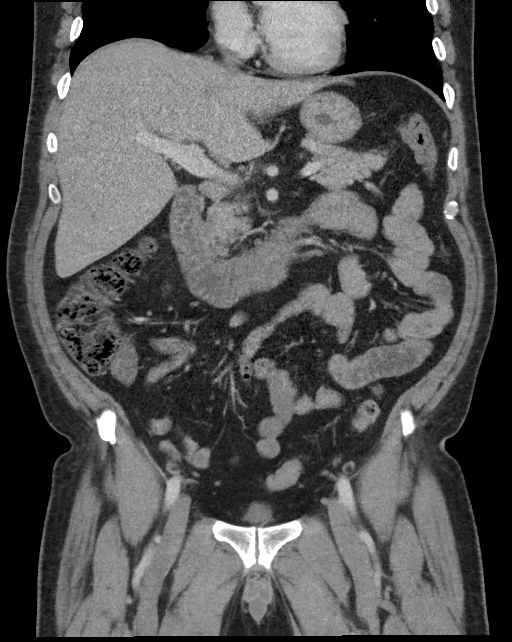
[im 56/101  soft-tissue]
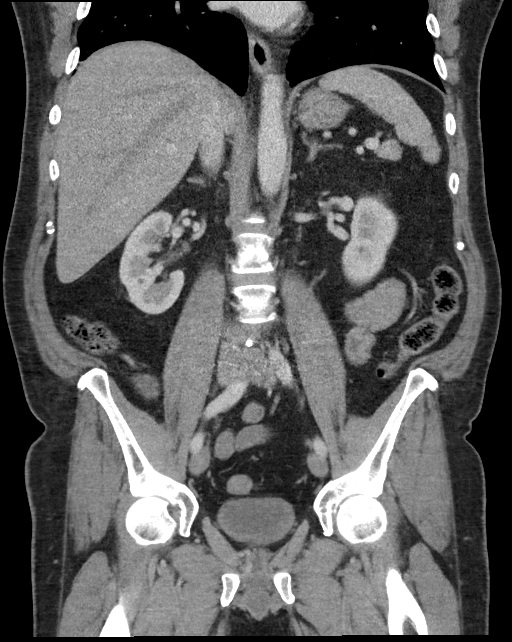

[14 of 46 positions shown; findings below may reference images not displayed]

FINDINGS: Lower chest:  Mild atelectasis.  No traumatic finding.

Hepatobiliary: No focal liver abnormality.Absent gallbladder. No
ductal dilatation.

Pancreas: Unremarkable.

Spleen: Unremarkable.

Adrenals/Urinary Tract: No evidence of renal or adrenal injury. Tiny
densities in the bilateral lower renal cortex that are too small for
accurate densitometry.

Stomach/Bowel:  No obstruction. No appendicitis.

Vascular/Lymphatic: No acute vascular abnormality. No mass or
adenopathy.

Reproductive:No pathologic findings.

Other: No ascites or pneumoperitoneum.

Musculoskeletal: No acute abnormalities. L4-5 PLIF. No visible
arthrodesis, but also no evidence screw and rod failure.
IMPRESSION: No evidence of injury.
